# Patient Record
Sex: Male | Born: 1955
Health system: Southern US, Community
[De-identification: ages and names within clinical notes are randomized; demographics above are authoritative.]

## PROBLEM LIST (undated history)

## (undated) DIAGNOSIS — I10 Essential (primary) hypertension: Secondary | ICD-10-CM

## (undated) DIAGNOSIS — E119 Type 2 diabetes mellitus without complications: Secondary | ICD-10-CM

---

## 2016-01-15 DIAGNOSIS — I10 Essential (primary) hypertension: Secondary | ICD-10-CM | POA: Diagnosis not present

## 2016-01-15 DIAGNOSIS — N19 Unspecified kidney failure: Secondary | ICD-10-CM | POA: Diagnosis not present

## 2016-01-15 DIAGNOSIS — N183 Chronic kidney disease, stage 3 (moderate): Secondary | ICD-10-CM | POA: Diagnosis not present

## 2016-01-15 DIAGNOSIS — R809 Proteinuria, unspecified: Secondary | ICD-10-CM | POA: Diagnosis not present

## 2016-01-15 DIAGNOSIS — E119 Type 2 diabetes mellitus without complications: Secondary | ICD-10-CM | POA: Diagnosis not present

## 2016-01-15 DIAGNOSIS — E871 Hypo-osmolality and hyponatremia: Secondary | ICD-10-CM | POA: Diagnosis not present

## 2016-03-04 DIAGNOSIS — N189 Chronic kidney disease, unspecified: Secondary | ICD-10-CM | POA: Diagnosis not present

## 2016-03-04 DIAGNOSIS — I10 Essential (primary) hypertension: Secondary | ICD-10-CM | POA: Diagnosis not present

## 2016-03-04 DIAGNOSIS — E039 Hypothyroidism, unspecified: Secondary | ICD-10-CM | POA: Diagnosis not present

## 2016-03-04 DIAGNOSIS — E119 Type 2 diabetes mellitus without complications: Secondary | ICD-10-CM | POA: Diagnosis not present

## 2016-04-29 DIAGNOSIS — F341 Dysthymic disorder: Secondary | ICD-10-CM | POA: Diagnosis not present

## 2016-04-29 DIAGNOSIS — Z0000 Encounter for general adult medical examination without abnormal findings: Secondary | ICD-10-CM | POA: Diagnosis not present

## 2016-04-29 DIAGNOSIS — E119 Type 2 diabetes mellitus without complications: Secondary | ICD-10-CM | POA: Diagnosis not present

## 2016-04-29 DIAGNOSIS — I10 Essential (primary) hypertension: Secondary | ICD-10-CM | POA: Diagnosis not present

## 2016-04-29 DIAGNOSIS — E785 Hyperlipidemia, unspecified: Secondary | ICD-10-CM | POA: Diagnosis not present

## 2016-04-29 DIAGNOSIS — E1165 Type 2 diabetes mellitus with hyperglycemia: Secondary | ICD-10-CM | POA: Diagnosis not present

## 2016-04-29 DIAGNOSIS — F419 Anxiety disorder, unspecified: Secondary | ICD-10-CM | POA: Diagnosis not present

## 2016-06-15 DIAGNOSIS — Z122 Encounter for screening for malignant neoplasm of respiratory organs: Secondary | ICD-10-CM | POA: Diagnosis not present

## 2016-06-15 DIAGNOSIS — Z87891 Personal history of nicotine dependence: Secondary | ICD-10-CM | POA: Diagnosis not present

## 2017-03-19 DIAGNOSIS — E1100 Type 2 diabetes mellitus with hyperosmolarity without nonketotic hyperglycemic-hyperosmolar coma (NKHHC): Secondary | ICD-10-CM | POA: Diagnosis not present

## 2017-03-19 DIAGNOSIS — E1110 Type 2 diabetes mellitus with ketoacidosis without coma: Secondary | ICD-10-CM | POA: Diagnosis not present

## 2017-03-19 DIAGNOSIS — G808 Other cerebral palsy: Secondary | ICD-10-CM | POA: Diagnosis not present

## 2017-03-19 DIAGNOSIS — I10 Essential (primary) hypertension: Secondary | ICD-10-CM | POA: Diagnosis not present

## 2017-03-19 DIAGNOSIS — Z113 Encounter for screening for infections with a predominantly sexual mode of transmission: Secondary | ICD-10-CM | POA: Diagnosis not present

## 2017-03-19 DIAGNOSIS — F99 Mental disorder, not otherwise specified: Secondary | ICD-10-CM | POA: Diagnosis not present

## 2017-04-14 DIAGNOSIS — G808 Other cerebral palsy: Secondary | ICD-10-CM | POA: Diagnosis not present

## 2017-04-14 DIAGNOSIS — B1920 Unspecified viral hepatitis C without hepatic coma: Secondary | ICD-10-CM | POA: Diagnosis not present

## 2017-04-14 DIAGNOSIS — A539 Syphilis, unspecified: Secondary | ICD-10-CM | POA: Diagnosis not present

## 2017-04-14 DIAGNOSIS — I10 Essential (primary) hypertension: Secondary | ICD-10-CM | POA: Diagnosis not present

## 2017-04-14 DIAGNOSIS — E1110 Type 2 diabetes mellitus with ketoacidosis without coma: Secondary | ICD-10-CM | POA: Diagnosis not present

## 2017-04-26 DIAGNOSIS — Z8619 Personal history of other infectious and parasitic diseases: Secondary | ICD-10-CM | POA: Diagnosis not present

## 2017-07-27 DIAGNOSIS — Z794 Long term (current) use of insulin: Secondary | ICD-10-CM | POA: Diagnosis not present

## 2017-07-27 DIAGNOSIS — E1322 Other specified diabetes mellitus with diabetic chronic kidney disease: Secondary | ICD-10-CM | POA: Diagnosis not present

## 2017-09-28 DIAGNOSIS — Z794 Long term (current) use of insulin: Secondary | ICD-10-CM | POA: Diagnosis not present

## 2017-09-28 DIAGNOSIS — H2513 Age-related nuclear cataract, bilateral: Secondary | ICD-10-CM | POA: Diagnosis not present

## 2017-09-28 DIAGNOSIS — E1159 Type 2 diabetes mellitus with other circulatory complications: Secondary | ICD-10-CM | POA: Diagnosis not present

## 2017-09-28 DIAGNOSIS — H25013 Cortical age-related cataract, bilateral: Secondary | ICD-10-CM | POA: Diagnosis not present

## 2017-09-28 DIAGNOSIS — I10 Essential (primary) hypertension: Secondary | ICD-10-CM | POA: Diagnosis not present

## 2017-09-28 DIAGNOSIS — E1322 Other specified diabetes mellitus with diabetic chronic kidney disease: Secondary | ICD-10-CM | POA: Diagnosis not present

## 2017-10-26 ENCOUNTER — Emergency Department (HOSPITAL_COMMUNITY): Payer: Medicare HMO

## 2017-10-26 ENCOUNTER — Emergency Department (HOSPITAL_COMMUNITY)
Admission: EM | Admit: 2017-10-26 | Discharge: 2017-10-26 | Disposition: A | Discharge status: Home / Self Care | Payer: Medicare HMO | Attending: Emergency Medicine | Admitting: Emergency Medicine

## 2017-10-26 ENCOUNTER — Encounter (HOSPITAL_COMMUNITY): Payer: Self-pay | Admitting: Emergency Medicine

## 2017-10-26 DIAGNOSIS — E1129 Type 2 diabetes mellitus with other diabetic kidney complication: Secondary | ICD-10-CM | POA: Diagnosis not present

## 2017-10-26 DIAGNOSIS — F17200 Nicotine dependence, unspecified, uncomplicated: Secondary | ICD-10-CM | POA: Diagnosis not present

## 2017-10-26 DIAGNOSIS — N183 Chronic kidney disease, stage 3 (moderate): Secondary | ICD-10-CM | POA: Diagnosis not present

## 2017-10-26 DIAGNOSIS — F1911 Other psychoactive substance abuse, in remission: Secondary | ICD-10-CM | POA: Diagnosis not present

## 2017-10-26 DIAGNOSIS — M2041 Other hammer toe(s) (acquired), right foot: Secondary | ICD-10-CM | POA: Diagnosis not present

## 2017-10-26 DIAGNOSIS — E1322 Other specified diabetes mellitus with diabetic chronic kidney disease: Secondary | ICD-10-CM | POA: Diagnosis not present

## 2017-10-26 DIAGNOSIS — R809 Proteinuria, unspecified: Secondary | ICD-10-CM | POA: Diagnosis not present

## 2017-10-26 DIAGNOSIS — E119 Type 2 diabetes mellitus without complications: Secondary | ICD-10-CM | POA: Insufficient documentation

## 2017-10-26 DIAGNOSIS — I10 Essential (primary) hypertension: Secondary | ICD-10-CM | POA: Diagnosis not present

## 2017-10-26 DIAGNOSIS — E1159 Type 2 diabetes mellitus with other circulatory complications: Secondary | ICD-10-CM | POA: Diagnosis not present

## 2017-10-26 DIAGNOSIS — E161 Other hypoglycemia: Secondary | ICD-10-CM | POA: Diagnosis not present

## 2017-10-26 DIAGNOSIS — Z720 Tobacco use: Secondary | ICD-10-CM | POA: Diagnosis not present

## 2017-10-26 DIAGNOSIS — Z8619 Personal history of other infectious and parasitic diseases: Secondary | ICD-10-CM | POA: Diagnosis not present

## 2017-10-26 DIAGNOSIS — E162 Hypoglycemia, unspecified: Secondary | ICD-10-CM | POA: Diagnosis not present

## 2017-10-26 DIAGNOSIS — E1122 Type 2 diabetes mellitus with diabetic chronic kidney disease: Secondary | ICD-10-CM | POA: Diagnosis not present

## 2017-10-26 DIAGNOSIS — N179 Acute kidney failure, unspecified: Secondary | ICD-10-CM

## 2017-10-26 DIAGNOSIS — Z794 Long term (current) use of insulin: Secondary | ICD-10-CM | POA: Diagnosis not present

## 2017-10-26 DIAGNOSIS — N189 Chronic kidney disease, unspecified: Secondary | ICD-10-CM

## 2017-10-26 DIAGNOSIS — J811 Chronic pulmonary edema: Secondary | ICD-10-CM | POA: Diagnosis not present

## 2017-10-26 HISTORY — DX: Type 2 diabetes mellitus without complications: E11.9

## 2017-10-26 HISTORY — DX: Essential (primary) hypertension: I10

## 2017-10-26 LAB — COMPREHENSIVE METABOLIC PANEL
ALBUMIN: 3 g/dL — AB (ref 3.5–5.0)
ALK PHOS: 75 U/L (ref 38–126)
ALT: 11 U/L (ref 0–44)
ANION GAP: 7 (ref 5–15)
AST: 16 U/L (ref 15–41)
BILIRUBIN TOTAL: 0.4 mg/dL (ref 0.3–1.2)
BUN: 30 mg/dL — AB (ref 6–20)
CALCIUM: 8.2 mg/dL — AB (ref 8.9–10.3)
CO2: 22 mmol/L (ref 22–32)
CREATININE: 2.8 mg/dL — AB (ref 0.61–1.24)
Chloride: 112 mmol/L — ABNORMAL HIGH (ref 98–111)
GFR calc Af Amer: 27 mL/min — ABNORMAL LOW (ref >60)
GFR calc non Af Amer: 23 mL/min — ABNORMAL LOW (ref >60)
GLUCOSE: 79 mg/dL (ref 70–99)
Potassium: 3.5 mmol/L (ref 3.5–5.1)
Sodium: 141 mmol/L (ref 135–145)
Total Protein: 6.4 g/dL — ABNORMAL LOW (ref 6.5–8.1)

## 2017-10-26 LAB — CBC WITH DIFFERENTIAL/PLATELET
Abs Immature Granulocytes: 0.03 10*3/uL (ref 0.00–0.07)
BASOS ABS: 0 10*3/uL (ref 0.0–0.1)
Basophils Relative: 0 %
EOS PCT: 1 %
Eosinophils Absolute: 0.1 10*3/uL (ref 0.0–0.5)
HEMATOCRIT: 33.7 % — AB (ref 39.0–52.0)
HEMOGLOBIN: 10.2 g/dL — AB (ref 13.0–17.0)
Immature Granulocytes: 0 %
LYMPHS ABS: 0.9 10*3/uL (ref 0.7–4.0)
LYMPHS PCT: 10 %
MCH: 25.6 pg — ABNORMAL LOW (ref 26.0–34.0)
MCHC: 30.3 g/dL (ref 30.0–36.0)
MCV: 84.7 fL (ref 80.0–100.0)
Monocytes Absolute: 0.7 10*3/uL (ref 0.1–1.0)
Monocytes Relative: 7 %
Neutro Abs: 7.4 10*3/uL (ref 1.7–7.7)
Neutrophils Relative %: 82 %
Platelets: 284 10*3/uL (ref 150–400)
RBC: 3.98 MIL/uL — ABNORMAL LOW (ref 4.22–5.81)
RDW: 15.3 % (ref 11.5–15.5)
WBC: 9.1 10*3/uL (ref 4.0–10.5)
nRBC: 0 % (ref 0.0–0.2)

## 2017-10-26 MED ORDER — LISINOPRIL 10 MG PO TABS
10.0000 mg | ORAL_TABLET | Freq: Once | ORAL | Status: AC
  Administered 2017-10-26: 10 mg via ORAL
  Filled 2017-10-26: qty 1

## 2017-10-26 MED ORDER — LISINOPRIL 10 MG PO TABS: 10.0000 mg | ORAL_TABLET | Freq: Every day | ORAL | 1 refills | Status: DC

## 2017-10-26 MED ORDER — AMLODIPINE BESYLATE 5 MG PO TABS: 10.0000 mg | ORAL_TABLET | Freq: Two times a day (BID) | ORAL | 1 refills | Status: AC

## 2017-10-26 MED ORDER — AMLODIPINE BESYLATE 5 MG PO TABS: 10.0000 mg | ORAL_TABLET | Freq: Two times a day (BID) | ORAL | 1 refills | Status: DC

## 2017-10-26 MED ORDER — AMLODIPINE BESYLATE 5 MG PO TABS
5.0000 mg | ORAL_TABLET | Freq: Once | ORAL | Status: AC
  Administered 2017-10-26: 5 mg via ORAL
  Filled 2017-10-26: qty 1

## 2017-10-26 MED ORDER — LISINOPRIL 10 MG PO TABS: 10.0000 mg | ORAL_TABLET | Freq: Every day | ORAL | 1 refills | Status: AC

## 2017-10-26 NOTE — ED Triage Notes (Signed)
Pt arrives via EMS from PCP where he was being seen for a routine visit when they noticed his BP was elevated and low CBG of 33. Pt was asymptomatic, given instant glucose and food, CBG now 130. Reports he took his insulin later than normal and did not eat much.

## 2017-10-26 NOTE — ED Notes (Signed)
Pt verbalized understanding of discharge paperwork, prescriptions and follow-up care. Pt ambulatory on discharged. Pt sent home with girlfriend.

## 2017-10-26 NOTE — ED Notes (Signed)
Patient transported to X-ray 

## 2017-10-26 NOTE — ED Provider Notes (Signed)
Emergency Department Provider Note   I have reviewed the triage vital signs and the nursing notes.   HISTORY  Chief Complaint Hypertension and Hypoglycemia   HPI Douglas Mitchell is a 62 y.o. male here from endocrinology office secondary to hypertension and high blood glycemia.  Patient is asymptomatic.  He was here for routine follow-up.  They found him to be hypertensive without any symptoms and a blood sugar in the 30s.  He states that he had chili this morning but did have many carbohydrates and took his NovoLog as directed.  He states he has stage III chronic kidney disease but has not any recent dosing changes in medications.  Endocrinology sent him here because of blood pressures.  Was given something to eat and his glucose apparently improved to 130.  He denies chest pain, shortness of breath, vision changes, headache, urine changes, lower extremity edema. No other associated or modifying symptoms.    Past Medical History:  Diagnosis Date  . Diabetes mellitus without complication (HCC)   . Hypertension     There are no active problems to display for this patient.   History reviewed. No pertinent surgical history.    Allergies Patient has no allergy information on record.  No family history on file.  Social History Social History   Tobacco Use  . Smoking status: Not on file  Substance Use Topics  . Alcohol use: Not on file  . Drug use: Not on file    Review of Systems  All other systems negative except as documented in the HPI. All pertinent positives and negatives as reviewed in the HPI. ____________________________________________   PHYSICAL EXAM:  VITAL SIGNS: ED Triage Vitals  Enc Vitals Group     BP 10/26/17 1446 (!) 236/99     Pulse Rate 10/26/17 1446 71     Resp 10/26/17 1446 17     Temp 10/26/17 1446 98.3 F (36.8 C)     Temp Source 10/26/17 1446 Oral     SpO2 10/26/17 1443 100 %    Constitutional: Alert and oriented. Well appearing and  in no acute distress. Eyes: Conjunctivae are normal. PERRL. EOMI. Head: Atraumatic. Nose: No congestion/rhinnorhea. Mouth/Throat: Mucous membranes are moist.  Oropharynx non-erythematous. Neck: No stridor.  No meningeal signs.   Cardiovascular: Normal rate, regular rhythm. Good peripheral circulation. Grossly normal heart sounds.   Respiratory: Normal respiratory effort.  No retractions. Lungs CTAB. Gastrointestinal: Soft and nontender. No distention.  Musculoskeletal: No lower extremity tenderness nor edema. No gross deformities of extremities. Neurologic:  Normal speech and language. No gross focal neurologic deficits are appreciated.  Skin:  Skin is warm, dry and intact. No rash noted.   ____________________________________________   LABS (all labs ordered are listed, but only abnormal results are displayed)  Labs Reviewed  CBC WITH DIFFERENTIAL/PLATELET - Abnormal; Notable for the following components:      Result Value   RBC 3.98 (*)    Hemoglobin 10.2 (*)    HCT 33.7 (*)    MCH 25.6 (*)    All other components within normal limits  COMPREHENSIVE METABOLIC PANEL - Abnormal; Notable for the following components:   Chloride 112 (*)    BUN 30 (*)    Creatinine, Ser 2.80 (*)    Calcium 8.2 (*)    Total Protein 6.4 (*)    Albumin 3.0 (*)    GFR calc non Af Amer 23 (*)    GFR calc Af Amer 27 (*)  All other components within normal limits   ____________________________________________  EKG   EKG Interpretation  Date/Time:    Ventricular Rate:    PR Interval:    QRS Duration:   QT Interval:    QTC Calculation:   R Axis:     Text Interpretation:         ____________________________________________  RADIOLOGY  Dg Chest 2 View  Result Date: 10/26/2017 CLINICAL DATA:  Pulmonary edema.  Smoker. EXAM: CHEST - 2 VIEW COMPARISON:  None. FINDINGS: The lungs are hyperinflated likely secondary to COPD. There is no focal parenchymal opacity. There is no pleural  effusion or pneumothorax. The heart and mediastinal contours are unremarkable. The osseous structures are unremarkable. IMPRESSION: No acute cardiopulmonary disease. Electronically Signed   By: Elige Ko   On: 10/26/2017 16:14    ____________________________________________   PROCEDURES  Procedure(s) performed:   Procedures   ____________________________________________   INITIAL IMPRESSION / ASSESSMENT AND PLAN / ED COURSE  With a hypoglycemia on a steady dose of insulin concern for possible acute on chronic kidney injury with his high blood pressure so we will check that.  We will go and get some oral medications and plan on increasing his oral regimen at home as he does not have a primary doctor at this time.  His endocrinologist is going to refer him to 1 so we will probably give a prescription for some medications on the discharge.  Kidney function slightly diminished since July however not significantly so.  Looks like in the records it was 2.44 then and now is 2.8.  We will start lisinopril and increase his dose of amlodipine.  He states his endocrinologist is in get him a primary care appointment in [facility] where he lives which is what he prefers.  No other evidence of endorgan damage.  Patient stable for discharge on new medications and PCP follow-up at this time with improving blood pressures.  Pertinent labs & imaging results that were available during my care of the patient were reviewed by me and considered in my medical decision making (see chart for details).  ____________________________________________  FINAL CLINICAL IMPRESSION(S) / ED DIAGNOSES  Final diagnoses:  Hypertension, unspecified type  Acute kidney injury superimposed on chronic kidney disease (HCC)     MEDICATIONS GIVEN DURING THIS VISIT:  Medications  amLODipine (NORVASC) tablet 5 mg (5 mg Oral Given 10/26/17 1547)  lisinopril (PRINIVIL,ZESTRIL) tablet 10 mg (10 mg Oral Given 10/26/17 1547)       NEW OUTPATIENT MEDICATIONS STARTED DURING THIS VISIT:  Current Discharge Medication List    START taking these medications   Details  amLODipine (NORVASC) 5 MG tablet Take 2 tablets (10 mg total) by mouth 2 (two) times daily. Qty: 120 tablet, Refills: 1    lisinopril (PRINIVIL,ZESTRIL) 10 MG tablet Take 1 tablet (10 mg total) by mouth daily. Qty: 30 tablet, Refills: 1        Note:  This note was prepared with assistance of Dragon voice recognition software. Occasional wrong-word or sound-a-like substitutions may have occurred due to the inherent limitations of voice recognition software.   Marily Memos, MD 10/26/17 [phone]

## 2017-10-28 DIAGNOSIS — I129 Hypertensive chronic kidney disease with stage 1 through stage 4 chronic kidney disease, or unspecified chronic kidney disease: Secondary | ICD-10-CM | POA: Diagnosis not present

## 2017-10-28 DIAGNOSIS — D631 Anemia in chronic kidney disease: Secondary | ICD-10-CM | POA: Diagnosis not present

## 2017-10-28 DIAGNOSIS — R946 Abnormal results of thyroid function studies: Secondary | ICD-10-CM | POA: Diagnosis not present

## 2017-10-28 DIAGNOSIS — E1159 Type 2 diabetes mellitus with other circulatory complications: Secondary | ICD-10-CM | POA: Diagnosis not present

## 2017-10-28 DIAGNOSIS — E8809 Other disorders of plasma-protein metabolism, not elsewhere classified: Secondary | ICD-10-CM | POA: Diagnosis not present

## 2017-10-28 DIAGNOSIS — N184 Chronic kidney disease, stage 4 (severe): Secondary | ICD-10-CM | POA: Diagnosis not present

## 2017-10-28 DIAGNOSIS — E1122 Type 2 diabetes mellitus with diabetic chronic kidney disease: Secondary | ICD-10-CM | POA: Diagnosis not present

## 2017-10-28 DIAGNOSIS — F17210 Nicotine dependence, cigarettes, uncomplicated: Secondary | ICD-10-CM | POA: Diagnosis not present

## 2017-10-28 DIAGNOSIS — Z8619 Personal history of other infectious and parasitic diseases: Secondary | ICD-10-CM | POA: Diagnosis not present

## 2017-10-28 DIAGNOSIS — F330 Major depressive disorder, recurrent, mild: Secondary | ICD-10-CM | POA: Diagnosis not present

## 2017-11-01 DIAGNOSIS — Z794 Long term (current) use of insulin: Secondary | ICD-10-CM | POA: Diagnosis not present

## 2017-11-01 DIAGNOSIS — N184 Chronic kidney disease, stage 4 (severe): Secondary | ICD-10-CM | POA: Diagnosis not present

## 2017-11-01 DIAGNOSIS — E1322 Other specified diabetes mellitus with diabetic chronic kidney disease: Secondary | ICD-10-CM | POA: Diagnosis not present

## 2017-11-05 DIAGNOSIS — E1322 Other specified diabetes mellitus with diabetic chronic kidney disease: Secondary | ICD-10-CM | POA: Diagnosis not present

## 2017-11-05 DIAGNOSIS — N184 Chronic kidney disease, stage 4 (severe): Secondary | ICD-10-CM | POA: Diagnosis not present

## 2017-11-05 DIAGNOSIS — E1159 Type 2 diabetes mellitus with other circulatory complications: Secondary | ICD-10-CM | POA: Diagnosis not present

## 2017-11-05 DIAGNOSIS — Z794 Long term (current) use of insulin: Secondary | ICD-10-CM | POA: Diagnosis not present

## 2017-11-05 DIAGNOSIS — I10 Essential (primary) hypertension: Secondary | ICD-10-CM | POA: Diagnosis not present

## 2019-12-07 IMAGING — CR DG CHEST 2V
2 series · 2 of 2 positions shown · non-contrast
Comparison: None.

CLINICAL DATA: Pulmonary edema.  Smoker.

EXAM:
CHEST - 2 VIEW

[chest pa]
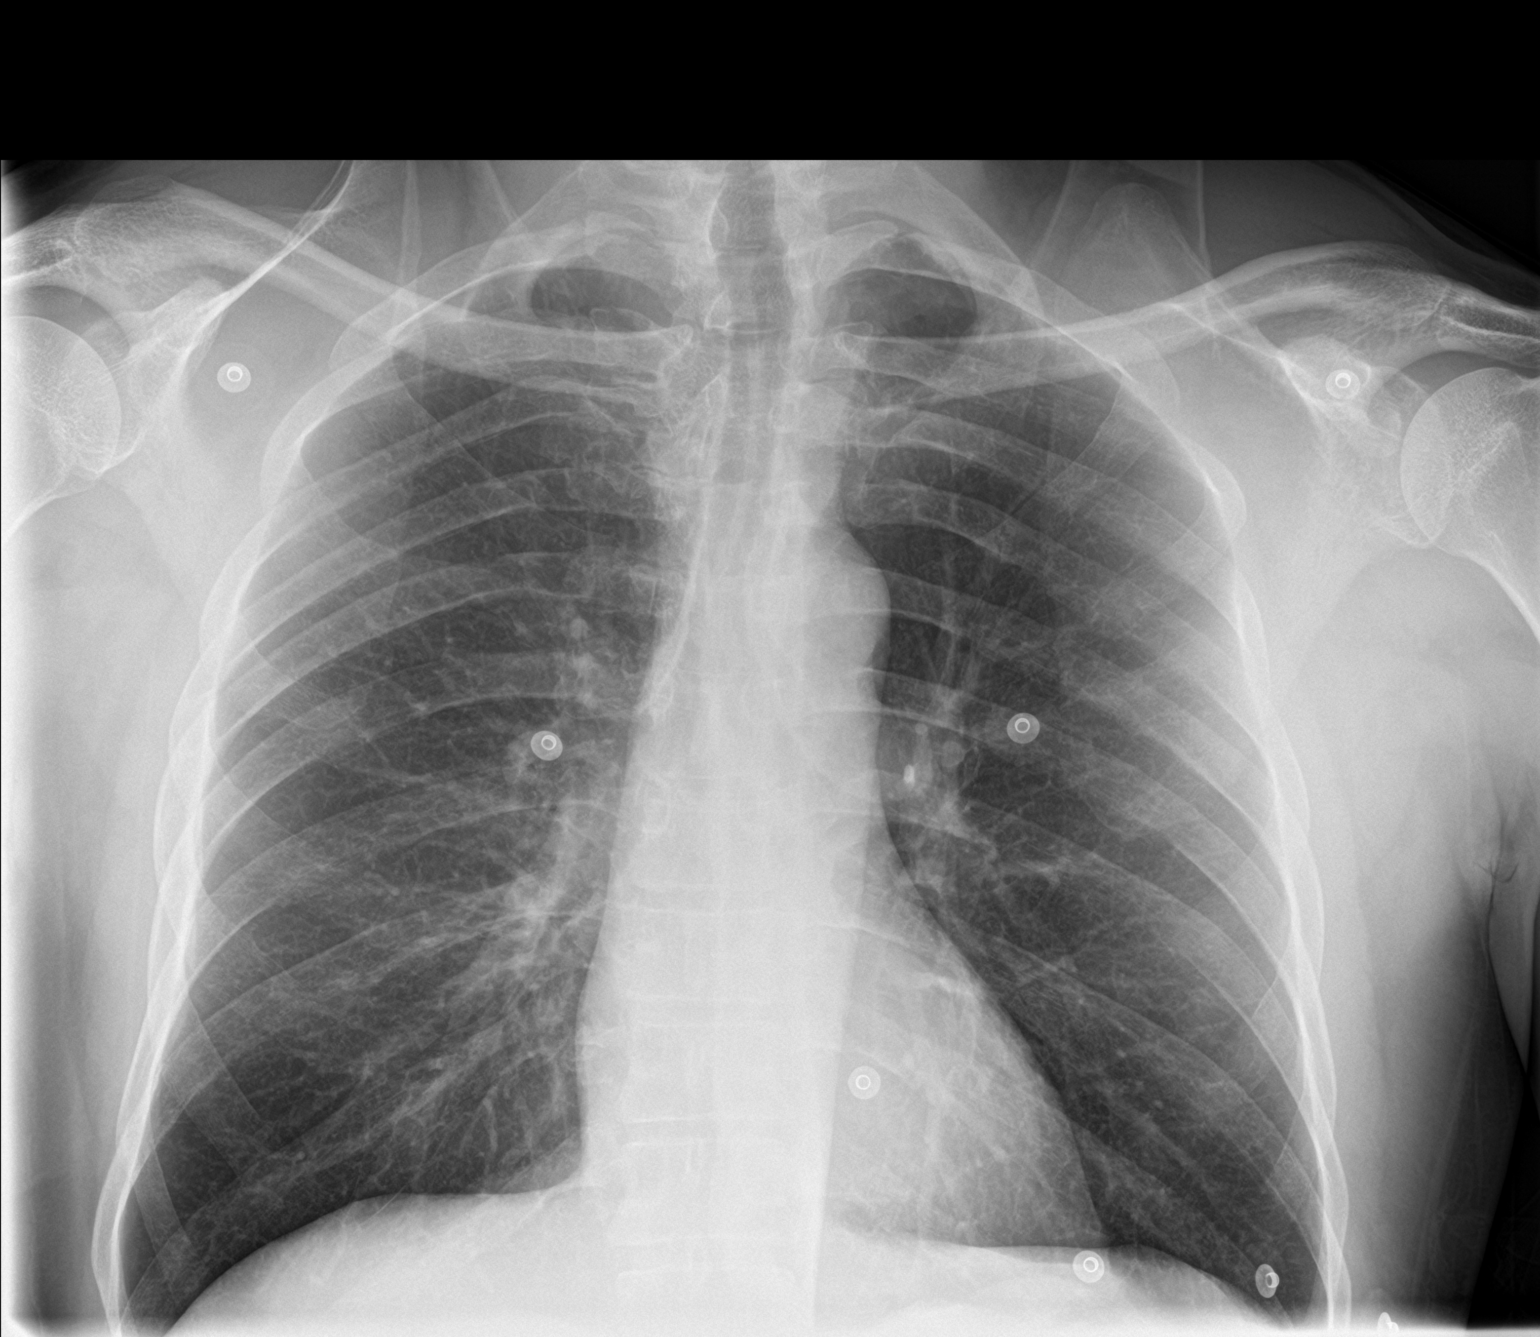

[chest lat]
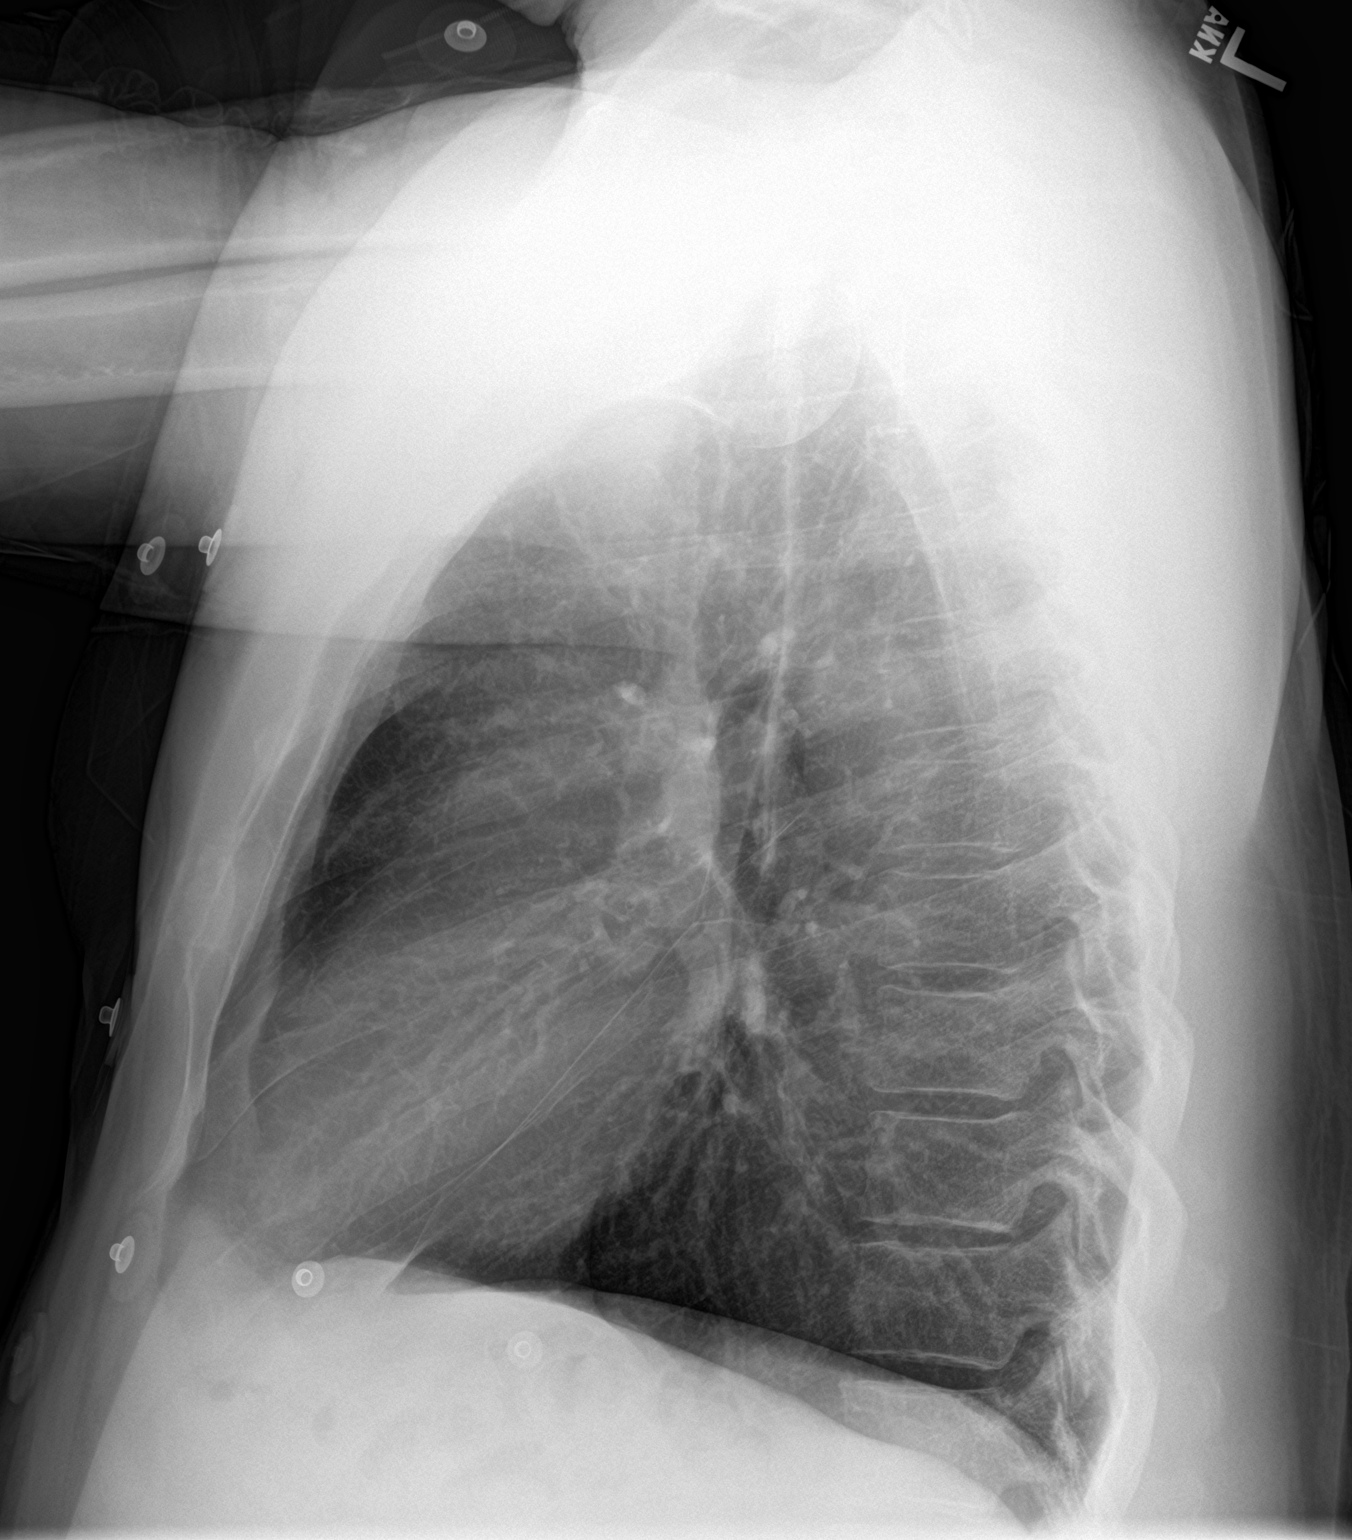

[2 of 2 positions shown; findings below may reference images not displayed]

FINDINGS: The lungs are hyperinflated likely secondary to COPD. There is no
focal parenchymal opacity. There is no pleural effusion or
pneumothorax. The heart and mediastinal contours are unremarkable.

The osseous structures are unremarkable.
IMPRESSION: No acute cardiopulmonary disease.
